# Patient Record
Sex: Male | Born: 1950 | Race: Black or African American | Hispanic: No | Smoking: Current every day smoker
Health system: Southern US, Community
[De-identification: ages and names within clinical notes are randomized; demographics above are authoritative.]

## PROBLEM LIST (undated history)

## (undated) DIAGNOSIS — I10 Essential (primary) hypertension: Secondary | ICD-10-CM

## (undated) DIAGNOSIS — C61 Malignant neoplasm of prostate: Secondary | ICD-10-CM

## (undated) DIAGNOSIS — E119 Type 2 diabetes mellitus without complications: Secondary | ICD-10-CM

## (undated) HISTORY — DX: Essential (primary) hypertension: I10

## (undated) HISTORY — PX: PROSTATE BIOPSY: SHX241

## (undated) HISTORY — DX: Type 2 diabetes mellitus without complications: E11.9

---

## 2003-12-13 ENCOUNTER — Emergency Department (HOSPITAL_COMMUNITY): Admission: EM | Admit: 2003-12-13 | Discharge: 2003-12-13 | Payer: Self-pay | Admitting: Family Medicine

## 2007-01-05 ENCOUNTER — Emergency Department (HOSPITAL_COMMUNITY): Admission: EM | Admit: 2007-01-05 | Discharge: 2007-01-05 | Payer: Self-pay | Admitting: Emergency Medicine

## 2015-11-01 DIAGNOSIS — E1159 Type 2 diabetes mellitus with other circulatory complications: Secondary | ICD-10-CM | POA: Insufficient documentation

## 2015-11-01 DIAGNOSIS — N3943 Post-void dribbling: Secondary | ICD-10-CM | POA: Insufficient documentation

## 2015-11-27 ENCOUNTER — Ambulatory Visit: Payer: Self-pay | Admitting: Cardiovascular Disease

## 2017-03-25 DIAGNOSIS — F17200 Nicotine dependence, unspecified, uncomplicated: Secondary | ICD-10-CM | POA: Insufficient documentation

## 2017-04-02 ENCOUNTER — Encounter: Payer: Self-pay | Admitting: Gastroenterology

## 2017-05-23 ENCOUNTER — Ambulatory Visit (AMBULATORY_SURGERY_CENTER): Payer: Self-pay | Admitting: *Deleted

## 2017-05-23 VITALS — Ht 70.0 in | Wt 197.2 lb

## 2017-05-23 DIAGNOSIS — Z1211 Encounter for screening for malignant neoplasm of colon: Secondary | ICD-10-CM

## 2017-05-23 MED ORDER — NA SULFATE-K SULFATE-MG SULF 17.5-3.13-1.6 GM/177ML PO SOLN: 1.0000 [IU] | Freq: Once | ORAL | 0 refills | Status: AC

## 2017-05-23 NOTE — Progress Notes (Signed)
No egg or soy allergy known to patient  No issues with past sedation with any surgeries  or procedures, no intubation problems  No diet pills per patient No home 02 use per patient  No blood thinners per patient  Pt denies issues with constipation  No A fib or A flutter  EMMI video sent to pt's e mail  

## 2017-05-26 ENCOUNTER — Encounter: Payer: Self-pay | Admitting: Gastroenterology

## 2017-06-02 ENCOUNTER — Encounter: Payer: Self-pay | Admitting: Gastroenterology

## 2017-06-02 ENCOUNTER — Ambulatory Visit (AMBULATORY_SURGERY_CENTER): Payer: Medicare Other | Admitting: Gastroenterology

## 2017-06-02 VITALS — BP 132/86 | HR 72 | Temp 97.3°F | Resp 10 | Ht 70.0 in | Wt 197.0 lb

## 2017-06-02 DIAGNOSIS — Z1212 Encounter for screening for malignant neoplasm of rectum: Secondary | ICD-10-CM

## 2017-06-02 DIAGNOSIS — D123 Benign neoplasm of transverse colon: Secondary | ICD-10-CM | POA: Diagnosis not present

## 2017-06-02 DIAGNOSIS — D125 Benign neoplasm of sigmoid colon: Secondary | ICD-10-CM | POA: Diagnosis not present

## 2017-06-02 DIAGNOSIS — D124 Benign neoplasm of descending colon: Secondary | ICD-10-CM

## 2017-06-02 DIAGNOSIS — Z1211 Encounter for screening for malignant neoplasm of colon: Secondary | ICD-10-CM | POA: Diagnosis not present

## 2017-06-02 MED ORDER — SODIUM CHLORIDE 0.9 % IV SOLN: 500.0000 mL | INTRAVENOUS | Status: AC

## 2017-06-02 NOTE — Patient Instructions (Signed)
Colon polyps removed today. Handouts given on polyps,diverticulosis, hemorrhoids. Result letter in your mail in 2-3 weeks. NO ASPIRIN, ASPIRIN CONTAINING PRODUCTS (BC OR GOODY POWDERS) OR NSAIDS (IBUPROFEN, ADVIL, ALEVE, AND MOTRIN) FOR 2 WEEKS; TYLENOL IS OK TO TAKE. Call us with any questions or concerns. Thank you!   YOU HAD AN ENDOSCOPIC PROCEDURE TODAY AT [facility]:   Refer to the procedure report that was given to you for any specific questions about what was found during the examination.  If the procedure report does not answer your questions, please call your gastroenterologist to clarify.  If you requested that your care partner not be given the details of your procedure findings, then the procedure report has been included in a sealed envelope for you to review at your convenience later.  YOU SHOULD EXPECT: Some feelings of bloating in the abdomen. Passage of more gas than usual.  Walking can help get rid of the air that was put into your GI tract during the procedure and reduce the bloating. If you had a lower endoscopy (such as a colonoscopy or flexible sigmoidoscopy) you may notice spotting of blood in your stool or on the toilet paper. If you underwent a bowel prep for your procedure, you may not have a normal bowel movement for a few days.  Please Note:  You might notice some irritation and congestion in your nose or some drainage.  This is from the oxygen used during your procedure.  There is no need for concern and it should clear up in a day or so.  SYMPTOMS TO REPORT IMMEDIATELY:   Following lower endoscopy (colonoscopy or flexible sigmoidoscopy):  Excessive amounts of blood in the stool  Significant tenderness or worsening of abdominal pains  Swelling of the abdomen that is new, acute  Fever of 100F or higher  For urgent or emergent issues, a gastroenterologist can be reached at any hour by calling [phone].   DIET:  We do recommend a small meal at  first, but then you may proceed to your regular diet.  Drink plenty of fluids but you should avoid alcoholic beverages for 24 hours.  ACTIVITY:  You should plan to take it easy for the rest of today and you should NOT DRIVE or use heavy machinery until tomorrow (because of the sedation medicines used during the test).    FOLLOW UP: Our staff will call the number listed on your records the next business day following your procedure to check on you and address any questions or concerns that you may have regarding the information given to you following your procedure. If we do not reach you, we will leave a message.  However, if you are feeling well and you are not experiencing any problems, there is no need to return our call.  We will assume that you have returned to your regular daily activities without incident.  If any biopsies were taken you will be contacted by phone or by letter within the next 1-3 weeks.  Please call us at [phone] if you have not heard about the biopsies in 3 weeks.    SIGNATURES/CONFIDENTIALITY: You and/or your care partner have signed paperwork which will be entered into your electronic medical record.  These signatures attest to the fact that that the information above on your After Visit Summary has been reviewed and is understood.  Full responsibility of the confidentiality of this discharge information lies with you and/or your care-partner.

## 2017-06-02 NOTE — Op Note (Signed)
[facility] Patient Name: Blake Camacho Procedure Date: 06/02/2017 1:10 PM MRN: [id number] Endoscopist: Remo Lipps P. Leyton Brownlee MD, MD Age: 66 Referring MD:  Date of Birth: 07-28-1951 Gender: Male Account #: [id number] Procedure:                Colonoscopy Indications:              Screening for colorectal malignant neoplasm, This                            is the patient's first colonoscopy Medicines:                Monitored Anesthesia Care Procedure:                Pre-Anesthesia Assessment:                           - Prior to the procedure, a History and Physical                            was performed, and patient medications and                            allergies were reviewed. The patient's tolerance of                            previous anesthesia was also reviewed. The risks                            and benefits of the procedure and the sedation                            options and risks were discussed with the patient.                            All questions were answered, and informed consent                            was obtained. Prior Anticoagulants: The patient has                            taken no previous anticoagulant or antiplatelet                            agents. ASA Grade Assessment: III - A patient with                            severe systemic disease. After reviewing the risks                            and benefits, the patient was deemed in                            satisfactory condition to undergo the procedure.  After obtaining informed consent, the colonoscope                            was passed under direct vision. Throughout the                            procedure, the patient's blood pressure, pulse, and                            oxygen saturations were monitored continuously. The                            Colonoscope was introduced through the anus and                            advanced to the  the terminal ileum, with                            identification of the appendiceal orifice and IC                            valve. The colonoscopy was performed without                            difficulty. The patient tolerated the procedure                            well. The quality of the bowel preparation was                            good. The terminal ileum, ileocecal valve,                            appendiceal orifice, and rectum were photographed. Scope In: 1:16:14 PM Scope Out: 1:31:45 PM Scope Withdrawal Time: 0 hours 12 minutes 56 seconds  Total Procedure Duration: 0 hours 15 minutes 31 seconds  Findings:                 The perianal and digital rectal examinations were                            normal.                           The terminal ileum appeared normal.                           A 4 mm polyp was found in the transverse colon. The                            polyp was sessile. The polyp was removed with a                            cold snare. Resection and retrieval were complete.  A 4 mm polyp was found in the splenic flexure. The                            polyp was sessile. The polyp was removed with a                            cold snare. Resection and retrieval were complete.                           A 4 mm polyp was found in the descending colon. The                            polyp was sessile. The polyp was removed with a                            cold snare. Resection and retrieval were complete.                           A 3 mm polyp was found in the sigmoid colon. The                            polyp was sessile. The polyp was removed with a                            cold snare. Resection and retrieval were complete.                           A few medium-mouthed diverticula were found in the                            sigmoid colon.                           Internal hemorrhoids were found during retroflexion.                            The exam was otherwise without abnormality. Complications:            No immediate complications. Estimated blood loss:                            Minimal. Estimated Blood Loss:     Estimated blood loss was minimal. Impression:               - The examined portion of the ileum was normal.                           - One 4 mm polyp in the transverse colon, removed                            with a cold snare. Resected and retrieved.                           -  One 4 mm polyp at the splenic flexure, removed                            with a cold snare. Resected and retrieved.                           - One 4 mm polyp in the descending colon, removed                            with a cold snare. Resected and retrieved.                           - One 3 mm polyp in the sigmoid colon, removed with                            a cold snare. Resected and retrieved.                           - Diverticulosis in the sigmoid colon.                           - Internal hemorrhoids.                           - The examination was otherwise normal. Recommendation:           - Patient has a contact number available for                            emergencies. The signs and symptoms of potential                            delayed complications were discussed with the                            patient. Return to normal activities tomorrow.                            Written discharge instructions were provided to the                            patient.                           - Resume previous diet.                           - Continue present medications.                           - Await pathology results.                           - Repeat colonoscopy is recommended for  surveillance. The colonoscopy date will be                            determined after pathology results from today's                            exam become available for review.                            - No ibuprofen, naproxen, or other non-steroidal                            anti-inflammatory drugs for 2 weeks after polyp                            removal. Remo Lipps P. Pedro Whiters MD, MD 06/02/2017 1:38:29 PM This report has been signed electronically.

## 2017-06-02 NOTE — Progress Notes (Signed)
Called to room to assist during endoscopic procedure.  Patient ID and intended procedure confirmed with present staff. Received instructions for my participation in the procedure from the performing physician.  

## 2017-06-02 NOTE — Progress Notes (Signed)
To recovery, report to Myers, RN, VSS. 

## 2017-06-03 ENCOUNTER — Telehealth: Payer: Self-pay | Admitting: *Deleted

## 2017-06-03 NOTE — Telephone Encounter (Signed)
  Follow up Call-  Call back number 06/02/2017  Post procedure Call Back phone  # [phone]  Permission to leave phone message Yes  Some recent data might be hidden     Patient questions:  Do you have a fever, pain , or abdominal swelling? No. Pain Score  0 *  Have you tolerated food without any problems? Yes.    Have you been able to return to your normal activities? Yes.    Do you have any questions about your discharge instructions: Diet   No. Medications  No. Follow up visit  No.  Do you have questions or concerns about your Care? No.  Actions: * If pain score is 4 or above: No action needed, pain <4.

## 2017-06-03 NOTE — Telephone Encounter (Signed)
  Follow up Call-  Call back number 06/02/2017  Post procedure Call Back phone  # [phone]  Permission to leave phone message Yes  Some recent data might be hidden     No answer, left message.

## 2017-06-10 ENCOUNTER — Encounter: Payer: Self-pay | Admitting: Gastroenterology

## 2017-06-26 DIAGNOSIS — E1169 Type 2 diabetes mellitus with other specified complication: Secondary | ICD-10-CM | POA: Insufficient documentation

## 2018-07-17 ENCOUNTER — Other Ambulatory Visit: Payer: Self-pay | Admitting: Family Medicine

## 2018-07-17 DIAGNOSIS — Z136 Encounter for screening for cardiovascular disorders: Secondary | ICD-10-CM

## 2018-07-28 ENCOUNTER — Ambulatory Visit
Admission: RE | Admit: 2018-07-28 | Discharge: 2018-07-28 | Disposition: A | Discharge status: Home / Self Care | Payer: Medicare Other | Source: Ambulatory Visit | Attending: Family Medicine | Admitting: Family Medicine

## 2018-07-28 DIAGNOSIS — Z136 Encounter for screening for cardiovascular disorders: Secondary | ICD-10-CM

## 2019-04-19 DIAGNOSIS — F5101 Primary insomnia: Secondary | ICD-10-CM | POA: Insufficient documentation

## 2019-07-17 DIAGNOSIS — R972 Elevated prostate specific antigen [PSA]: Secondary | ICD-10-CM | POA: Insufficient documentation

## 2019-08-23 ENCOUNTER — Other Ambulatory Visit: Payer: Self-pay | Admitting: Urology

## 2019-08-23 ENCOUNTER — Other Ambulatory Visit (HOSPITAL_COMMUNITY): Payer: Self-pay | Admitting: Urology

## 2019-08-23 DIAGNOSIS — C61 Malignant neoplasm of prostate: Secondary | ICD-10-CM

## 2019-09-06 ENCOUNTER — Encounter (HOSPITAL_COMMUNITY)
Admission: RE | Admit: 2019-09-06 | Discharge: 2019-09-06 | Disposition: A | Discharge status: Home / Self Care | Payer: Medicare Other | Source: Ambulatory Visit | Attending: Urology | Admitting: Urology

## 2019-09-06 ENCOUNTER — Other Ambulatory Visit: Payer: Self-pay

## 2019-09-06 DIAGNOSIS — C61 Malignant neoplasm of prostate: Secondary | ICD-10-CM | POA: Diagnosis not present

## 2019-09-06 MED ORDER — TECHNETIUM TC 99M MEDRONATE IV KIT
21.8000 | PACK | Freq: Once | INTRAVENOUS | Status: AC
  Administered 2019-09-06: 12:00:00 21.8 via INTRAVENOUS

## 2019-09-16 ENCOUNTER — Other Ambulatory Visit (HOSPITAL_COMMUNITY): Payer: Self-pay | Admitting: Urology

## 2019-09-16 ENCOUNTER — Other Ambulatory Visit: Payer: Self-pay

## 2019-09-16 ENCOUNTER — Ambulatory Visit (HOSPITAL_COMMUNITY)
Admission: RE | Admit: 2019-09-16 | Discharge: 2019-09-16 | Disposition: A | Discharge status: Home / Self Care | Payer: Medicare Other | Source: Ambulatory Visit | Attending: Urology | Admitting: Urology

## 2019-09-16 DIAGNOSIS — R0981 Nasal congestion: Secondary | ICD-10-CM | POA: Diagnosis present

## 2019-09-16 DIAGNOSIS — M542 Cervicalgia: Secondary | ICD-10-CM

## 2019-09-23 ENCOUNTER — Encounter: Payer: Self-pay | Admitting: *Deleted

## 2019-09-30 ENCOUNTER — Encounter: Payer: Self-pay | Admitting: Radiation Oncology

## 2019-09-30 NOTE — Progress Notes (Signed)
GU Location of Tumor / Histology: prostatic adenocarcinoma  If Prostate Cancer, Gleason Score is (4 + 5) and PSA is (9.9) on 05/2019. Prostate volume: 32 g.  Blake Camacho went to his PCP for routine physical and blood work reveal his elevated PSA earlier this year. Patient denies a family history of breast, prostate, colon or pancreatic ca.  Biopsies of prostate (if applicable) revealed:   Past/Anticipated interventions by urology, if any: prostate biopsy, CT abd pelvis (negative), bone scan (negative), referral for consideration of radiotherapy  Past/Anticipated interventions by medical oncology, if any: no  Weight changes, if any: denies  Bowel/Bladder complaints, if any: IPSS 8. SHIM 3 due to inactivity. Denies dysuria, hematuria, leakage or incontinence. Reports urinary intermittency. Reports urinary hesitancy even having to strain at times.   Nausea/Vomiting, if any: denies  Pain issues, if any:  denies  SAFETY ISSUES:  Prior radiation? denies  Pacemaker/ICD? denies  Possible current pregnancy? no, male patient  Is the patient on methotrexate? no  Current Complaints / other details:  68 year old male. Smoker. Single. Lives alone. Retired at 58. Has difficulty sleeping.

## 2019-10-01 ENCOUNTER — Ambulatory Visit
Admission: RE | Admit: 2019-10-01 | Discharge: 2019-10-01 | Disposition: A | Discharge status: Home / Self Care | Payer: Medicare Other | Source: Ambulatory Visit | Attending: Radiation Oncology | Admitting: Radiation Oncology

## 2019-10-01 ENCOUNTER — Other Ambulatory Visit: Payer: Self-pay

## 2019-10-01 ENCOUNTER — Encounter: Payer: Self-pay | Admitting: Radiation Oncology

## 2019-10-01 VITALS — Ht 69.0 in | Wt 192.0 lb

## 2019-10-01 DIAGNOSIS — C61 Malignant neoplasm of prostate: Secondary | ICD-10-CM

## 2019-10-01 HISTORY — DX: Malignant neoplasm of prostate: C61

## 2019-10-01 NOTE — Progress Notes (Signed)
Radiation Oncology         (336) [phone] ________________________________  Initial outpatient Consultation - Conducted via Telephone due to current COVID-19 concerns for limiting patient exposure  Name: ROHNAN BARTLESON MRN: [id number]  Date: 10/01/2019  DOB: 09/02/1951  NT:[id number], Santiago Glad, MD  Festus Aloe, MD   REFERRING PHYSICIAN: Festus Aloe, MD  DIAGNOSIS: 68 y.o. 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    ICD-10-CM   1. Malignant neoplasm of prostate ([place])  C61     HISTORY OF PRESENT ILLNESS: NAZIRE FRUTH is a 68 y.o. male with a diagnosis of prostate cancer. He was noted to have an elevated PSA of 4.9 in 2017, by his primary care physician, Dr. Coletta Memos.  A referral was placed for urology, but he never went. His PSA continued to be monitored by his PCP as follows-- 03/2018 - 7.7 04/2019 - 10.6 05/2019 - 9.9 06/2019 - 10.2 ([facility]) 07/2019 - 10.8  At the time of his routine visit with his PCP in 04/2019, he was having increased LUTS and PSA was noted to be further elevated at 10.6.  The PSA was repeated in 05/2019 and remained elevated at 9.9 despite a course of antibiotics so he was referred for evaluation in urology and met with Dr. Junious Silk on 07/05/2019,  digital rectal examination was performed at that time revealing left lobe firmness.  The patient proceeded to transrectal ultrasound with 12 biopsies of the prostate on 08/17/2019.  The prostate volume measured 31.76 cc.  Out of 12 core biopsies, 7 were positive, including all left cores and one on the right.  The maximum Gleason score was 4+5, and this was seen in the left mid with perineural invasion. Additionally, Gleason 4+4 was seen in the right mid (two small foci), left base lateral (with PNI), and left apex (with PNI) and Gleason 4+3 was seen in the left mid lateral, left apex lateral (with PNI) and left base.  A CT A/P was performed on 08/31/2019 for  disease staging and was without evidence of lymphadenopathy or other definite findings for metastatic disease.  There were 3 small hypervascular foci in the liver parenchyma and probable bone islands in left femoral neck and head.  Bone scan performed on 09/06/2019 showed increased activity over the right maxillary sinus with punctate areas of increased activity over the cervical spine. In light of these areas of uptake, he underwent sinus and cervical spine x-rays on 09/16/2019. Sinus x-ray was negative and Cervical spine x-rays showed degenerative changes only.  The patient reviewed the biopsy results with his urologist and he has kindly been referred today for discussion of potential radiation treatment options.   PREVIOUS RADIATION THERAPY: No  PAST MEDICAL HISTORY:  Past Medical History:  Diagnosis Date  . Diabetes mellitus without complication ([place])   . Hypertension   . Prostate cancer ([place])       PAST SURGICAL HISTORY: Past Surgical History:  Procedure Laterality Date  . PROSTATE BIOPSY      FAMILY HISTORY:  Family History  Problem Relation Age of Onset  . Diabetes Mother   . Asthma Mother   . Colon cancer Neg Hx   . Colon polyps Neg Hx   . Esophageal cancer Neg Hx   . Rectal cancer Neg Hx   . Stomach cancer Neg Hx   . Breast cancer Neg Hx   . Prostate cancer Neg Hx     SOCIAL HISTORY:  Social History   Socioeconomic History  . Marital status: Legally Separated    Spouse name: Not on file  . Number of children: Not on file  . Years of education: Not on file  . Highest education level: Not on file  Occupational History  . Not on file  Tobacco Use  . Smoking status: Current Every Day Smoker    Packs/day: 1.50    Years: 50.00    Pack years: 75.00    Types: Cigarettes  . Smokeless tobacco: Never Used  Substance and Sexual Activity  . Alcohol use: Yes    Comment: on occasions   . Drug use: No  . Sexual activity: Not Currently  Other Topics Concern  . Not  on file  Social History Narrative  . Not on file   Social Determinants of Health   Financial Resource Strain:   . Difficulty of Paying Living Expenses: Not on file  Food Insecurity:   . Worried About Charity fundraiser in the Last Year: Not on file  . Ran Out of Food in the Last Year: Not on file  Transportation Needs:   . Lack of Transportation (Medical): Not on file  . Lack of Transportation (Non-Medical): Not on file  Physical Activity:   . Days of Exercise per Week: Not on file  . Minutes of Exercise per Session: Not on file  Stress:   . Feeling of Stress : Not on file  Social Connections:   . Frequency of Communication with Friends and Family: Not on file  . Frequency of Social Gatherings with Friends and Family: Not on file  . Attends Religious Services: Not on file  . Active Member of Clubs or Organizations: Not on file  . Attends Archivist Meetings: Not on file  . Marital Status: Not on file  Intimate Partner Violence:   . Fear of Current or Ex-Partner: Not on file  . Emotionally Abused: Not on file  . Physically Abused: Not on file  . Sexually Abused: Not on file    ALLERGIES: Aspirin  MEDICATIONS:  Current Outpatient Medications  Medication Sig Dispense Refill  . amLODipine (NORVASC) 10 MG tablet TAKE ONE TABLET BY MOUTH ONCE DAILY    . atorvastatin (LIPITOR) 10 MG tablet TAKE ONE TABLET BY MOUTH ONCE DAILY    . hydrochlorothiazide (HYDRODIURIL) 25 MG tablet TAKE ONE TABLET BY MOUTH ONCE DAILY IN THE MORNING    . metFORMIN (GLUCOPHAGE) 500 MG tablet TAKE ONE TABLET BY MOUTH DAILY WITH BREAKFAST FOR 1 WEEK, THEN INCREASE TO ONE TABLET TWICE DAILY WITH BREAKFAST AND DINNER.     Current Facility-Administered Medications  Medication Dose Route Frequency Provider Last Rate Last Admin  . 0.9 %  sodium chloride infusion  500 mL Intravenous Continuous Armbruster, Carlota Raspberry, MD        REVIEW OF SYSTEMS:  On review of systems, the patient reports that he is  doing well overall. He denies any chest pain, shortness of breath, cough, fevers, chills, night sweats, unintended weight changes. He denies any bowel disturbances, and denies abdominal pain, nausea or vomiting. He denies any new musculoskeletal or joint aches or pains. His IPSS was 8, indicating moderate urinary symptoms. He reports urinary intermittency and hesitancy with having to strain at times. His SHIM was 3 due to inactivity. A complete review of systems is obtained and is otherwise negative.    PHYSICAL EXAM:  Wt Readings from Last 3 Encounters:  10/01/19 192 lb (87.1 kg)  06/02/17 197 lb (89.4 kg)  05/23/17 197 lb 3.2 oz (89.4 kg)   Temp Readings from Last 3 Encounters:  06/02/17 (!) 97.3 F (36.3 C)   BP Readings from Last 3 Encounters:  06/02/17 132/86   Pulse Readings from Last 3 Encounters:  06/02/17 72   Pain Assessment Pain Score: 0-No pain/10  Physical exam not performed in light of telephone consult visit format.   KPS = 90  100 - Normal; no complaints; no evidence of disease. 90   - Able to carry on normal activity; minor signs or symptoms of disease. 80   - Normal activity with effort; some signs or symptoms of disease. 28   - Cares for self; unable to carry on normal activity or to do active work. 60   - Requires occasional assistance, but is able to care for most of his personal needs. 50   - Requires considerable assistance and frequent medical care. 24   - Disabled; requires special care and assistance. 72   - Severely disabled; hospital admission is indicated although death not imminent. 36   - Very sick; hospital admission necessary; active supportive treatment necessary. 10   - Moribund; fatal processes progressing rapidly. 0     - Dead  Karnofsky DA, Abelmann WH, Craver LS and Burchenal JH [phone]) The use of the nitrogen mustards in the palliative treatment of carcinoma: with particular reference to bronchogenic carcinoma Cancer 1 634-56  LABORATORY  DATA:  No results found for: WBC, HGB, HCT, MCV, PLT No results found for: NA, K, CL, CO2 No results found for: ALT, AST, GGT, ALKPHOS, BILITOT   RADIOGRAPHY: DG Sinuses Complete  Result Date: 09/16/2019 CLINICAL DATA:  Sinus congestion.  Abnormal uptake on bone scan. EXAM: PARANASAL SINUSES - COMPLETE 3 + VIEW COMPARISON:  September 06, 2019. FINDINGS: The paranasal sinus are aerated. There is no evidence of sinus opacification air-fluid levels or mucosal thickening. No significant bone abnormalities are seen. IMPRESSION: Negative. Electronically Signed   By: Marijo Conception M.D.   On: 09/16/2019 14:13   DG Cervical Spine Complete  Result Date: 09/16/2019 CLINICAL DATA:  Neck pain. Abnormal uptake on bone scan. EXAM: CERVICAL SPINE - COMPLETE 4+ VIEW COMPARISON:  September 06, 2019. FINDINGS: Mild grade 1 retrolisthesis of C3-4 is noted secondary to severe degenerative disc disease at this level. Anterior osteophyte formation is also noted at C3-4. No fracture is noted. No prevertebral soft tissue swelling is noted. Remaining disc spaces are unremarkable. Mild neural foraminal stenosis is noted on the left at C3-4 secondary to uncovertebral spurring. IMPRESSION: Severe degenerative disc disease is noted at C3-4. Mild neural foraminal stenosis is noted on the left at C3-4 secondary to uncovertebral spurring. No acute abnormality seen in the cervical spine. Electronically Signed   By: Marijo Conception M.D.   On: 09/16/2019 14:15   NM Bone Scan Whole Body  Result Date: 09/07/2019 CLINICAL DATA:  Prostate cancer. EXAM: NUCLEAR MEDICINE WHOLE BODY BONE SCAN TECHNIQUE: Whole body anterior and posterior images were obtained approximately 3 hours after intravenous injection of radiopharmaceutical. RADIOPHARMACEUTICALS:  21.8 mCi Technetium-59mMDP IV COMPARISON:  CT images 08/31/2019, no report available. FINDINGS: Bilateral renal function excretion noted. Increased activity noted over the right maxillary  sinus. Although this could be related sinusitis, sinus series suggested to exclude bony lesion. Punctate areas of increased activity noted over the cervical spine. Cervical spine series suggested for further evaluation. Mild thoracolumbar spine scoliosis. No other bony abnormality identified. IMPRESSION: 1.  Increased activity noted over the right maxillary sinus. Although this could be related sinusitis, sinus series suggested to exclude bony lesion. 2. Punctate areas of increased activity noted over the cervical spine. Cervical spine series suggested for further evaluation. Electronically Signed   By: Marcello Moores  Register   On: 09/07/2019 07:30      IMPRESSION/PLAN: This visit was conducted via Telephone to spare the patient unnecessary potential exposure in the healthcare setting during the current COVID-19 pandemic. 1. 68 y.o. gentleman with Stage T2b adenocarcinoma of the prostate with Gleason Score of 4+5, and PSA of 10. We discussed the patient's workup and outlined the nature of prostate cancer in this setting. The patient's T stage, Gleason's score, and PSA put him into the high risk group. Accordingly, he is eligible for a variety of potential treatment options including LT-ADT in combination with either 8 weeks of external radiation or 5 weeks of external radiation combined with a brachytherapy boost. We discussed the available radiation techniques, and focused on the details and logistics and delivery. We discussed and outlined the risks, benefits, short and long-term effects associated with radiotherapy and compared and contrasted these with prostatectomy. We discussed the role of SpaceOAR in reducing the rectal toxicity associated with radiotherapy. We also detailed the role of ADT in the treatment of high risk prostate cancer and outlined the associated side effects that could be expected with this therapy.  We discussed the rationale behind the intentional delay of start of the radiation for  approximately 2 months after starting ADT to allow for the radiosensitizing effects of this therapy.  He was encouraged to ask questions that were answered to his stated satisfaction.  At the end of the conversation, the patient is interested in moving forward with 8 weeks of external beam therapy in combination with LT-ADT. We will share our discussion with Dr. Junious Silk and move forward with coordinating a follow up visit for start of ADT, first available.  We will also arrange for placement of fiducial markers and SpaceOAR gel to reduce rectal toxicity from radiotherapy, prior to CT simulation/treatment planning in anticipation of beginning IMRT in late February or early March 2021. He appears to have a good understanding of his disease and our treatment recommendations which are of curative intent.  He is comfortable and in agreement with the stated plan so we will proceed with treatment planning accordingly.  Given current concerns for patient exposure during the COVID-19 pandemic, this encounter was conducted via telephone. The patient was notified in advance and was offered a MyChart meeting to allow for face to face communication but unfortunately reported that he did not have the appropriate resources/technology to support such a visit and instead preferred to proceed with telephone consult. The patient has given verbal consent for this type of encounter. The time spent during this encounter was 60 minutes. The attendants for this meeting include Tyler Pita MD, Ashlyn Bruning PA-C, [place], patient, SAHAN PEN. During the encounter, Tyler Pita MD, Ashlyn Bruning PA-C, and scribe, Wilburn Mylar were located at [place].  Patient, REUVEN BRAVER was located at home.    Nicholos Johns, PA-C    Tyler Pita, MD  [facility] Direct Dial: [phone]  Fax:  [phone] [email or web address]  Skype  [email or web address]  This document serves as a record of services personally performed by Tyler Pita, MD and Freeman Caldron, PA-C. It was created on their behalf by Wilburn Mylar, a trained  medical scribe. The creation of this record is based on the scribe's personal observations and the provider's statements to them. This document has been checked and approved by the attending provider.

## 2019-10-01 NOTE — Progress Notes (Signed)
See progress note under physician encounter. 

## 2019-10-03 DIAGNOSIS — C61 Malignant neoplasm of prostate: Secondary | ICD-10-CM | POA: Insufficient documentation

## 2019-10-12 ENCOUNTER — Encounter: Payer: Self-pay | Admitting: Medical Oncology

## 2019-10-12 ENCOUNTER — Telehealth: Payer: Self-pay | Admitting: Medical Oncology

## 2019-10-13 ENCOUNTER — Encounter: Payer: Self-pay | Admitting: Urology

## 2019-10-13 NOTE — Progress Notes (Signed)
Patient received Eligard 45mg  10/12/19 with Dr. Junious Silk.  Request sent to [facility] to move forward with coordinating fiducial markers and SpaceOAR gel placement with Dr. Junious Silk in late 11/2019 in anticipation of beginning radiation treatments in early 12/2019.  Nicholos Johns, MMS, PA-C [facility]: [phone]  Fax: [phone]

## 2019-10-19 NOTE — Telephone Encounter (Signed)
Spoke with patient to introduce myself as the prostate nurse navigator and discuss my role. I was unable to meet him 12/18 when he consulted with Dr. Tammi Klippel. He states the visit went well and decided on ADT and 8 weeks of radiation. He received his ADT 12/29. I discussed with him the next step will be placement of gold markers and SpaceOar gel. He is aware Enid Derry will contact him with appointment. I gave him my contact information and asked him to call me with questions or concerns. He voiced understanding.

## 2019-11-04 ENCOUNTER — Telehealth: Payer: Self-pay | Admitting: *Deleted

## 2019-11-04 NOTE — Telephone Encounter (Signed)
Called patient to inform of fid. marker and space oar placement on 12-02-19 @ [facility] and his sim will be on Feb. 26 @ 9 am @ Dr. Johny Shears office, lvm for a return call

## 2019-11-05 ENCOUNTER — Other Ambulatory Visit: Payer: Self-pay | Admitting: Urology

## 2019-11-05 DIAGNOSIS — C61 Malignant neoplasm of prostate: Secondary | ICD-10-CM

## 2019-11-30 ENCOUNTER — Telehealth: Payer: Self-pay | Admitting: *Deleted

## 2019-11-30 ENCOUNTER — Other Ambulatory Visit: Payer: Self-pay | Admitting: Urology

## 2019-11-30 MED ORDER — LORAZEPAM 1 MG PO TABS: 1.0000 mg | ORAL_TABLET | ORAL | 0 refills | Status: AC | PRN

## 2019-11-30 NOTE — Telephone Encounter (Signed)
Called patient to inform of sim appt. for 12-10-19 and his MRI on 12-10-19, test to be @ [facility], spoke with patient and he is aware of these appts.

## 2019-12-06 ENCOUNTER — Telehealth: Payer: Self-pay | Admitting: *Deleted

## 2019-12-06 NOTE — Telephone Encounter (Signed)
CALLED PATIENT TO INFORM SIM AND MRI HAS BEEN MOVED TO 12-17-19, SPOKE WITH PATIENT AND HE IS AWARE OF THESE CHANGES AND IS GOOD WITH THEM

## 2019-12-07 ENCOUNTER — Ambulatory Visit (HOSPITAL_COMMUNITY): Payer: Medicare PPO

## 2019-12-10 ENCOUNTER — Encounter: Payer: Self-pay | Admitting: *Deleted

## 2019-12-10 ENCOUNTER — Ambulatory Visit (HOSPITAL_COMMUNITY): Payer: Medicare PPO

## 2019-12-10 ENCOUNTER — Ambulatory Visit: Payer: Medicare PPO | Admitting: Radiation Oncology

## 2019-12-16 ENCOUNTER — Telehealth: Payer: Self-pay | Admitting: *Deleted

## 2019-12-16 NOTE — Telephone Encounter (Signed)
CALLED PATIENT TO REMIND OF SIM AND MRI FOR 12-17-19, SPOKE WITH PATIENT AND HE IS AWARE OF THESE APPTS.

## 2019-12-17 ENCOUNTER — Encounter: Payer: Self-pay | Admitting: Medical Oncology

## 2019-12-17 ENCOUNTER — Ambulatory Visit
Admission: RE | Admit: 2019-12-17 | Discharge: 2019-12-17 | Disposition: A | Discharge status: Home / Self Care | Payer: Medicare PPO | Source: Ambulatory Visit | Attending: Radiation Oncology | Admitting: Radiation Oncology

## 2019-12-17 ENCOUNTER — Other Ambulatory Visit: Payer: Self-pay

## 2019-12-17 ENCOUNTER — Ambulatory Visit: Payer: Medicare PPO | Admitting: Radiation Oncology

## 2019-12-17 ENCOUNTER — Ambulatory Visit (HOSPITAL_COMMUNITY)
Admission: RE | Admit: 2019-12-17 | Discharge: 2019-12-17 | Disposition: A | Discharge status: Home / Self Care | Payer: Medicare PPO | Source: Ambulatory Visit | Attending: Urology | Admitting: Urology

## 2019-12-17 DIAGNOSIS — Z510 Encounter for antineoplastic radiation therapy: Secondary | ICD-10-CM | POA: Insufficient documentation

## 2019-12-17 DIAGNOSIS — C61 Malignant neoplasm of prostate: Secondary | ICD-10-CM | POA: Insufficient documentation

## 2019-12-19 NOTE — Progress Notes (Signed)
  Radiation Oncology         (336) [phone] ________________________________  Name: DEANTA COLANDREA MRN: [id number]  Date: 12/17/2019  DOB: [birth year]  SIMULATION AND TREATMENT PLANNING NOTE    ICD-10-CM   1. Malignant neoplasm of prostate ([place])  C61     DIAGNOSIS:  : 69 y.o. 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  Identity was confirmed.  All relevant records and images related to the planned course of therapy were reviewed.  The patient freely provided informed written consent to proceed with treatment after reviewing the details related to the planned course of therapy. The consent form was witnessed and verified by the simulation staff.  Then, the patient was set-up in a stable reproducible supine position for radiation therapy.  A vacuum lock pillow device was custom fabricated to position his legs in a reproducible immobilized position.  Then, I performed a urethrogram under sterile conditions to identify the prostatic bed.  CT images were obtained.  Surface markings were placed.  The CT images were loaded into the planning software.  Then the prostate bed target, pelvic lymph node target and avoidance structures including the rectum, bladder, bowel and hips were contoured.  Treatment planning then occurred.  The radiation prescription was entered and confirmed.  A total of one complex treatment devices were fabricated. I have requested : Intensity Modulated Radiotherapy (IMRT) is medically necessary for this case for the following reason:  Rectal sparing.Marland Kitchen  PLAN:  The patient will receive 45 Gy in 25 fractions of 1.8 Gy, followed by a boost to the prostate to a total dose of 75 Gy with 15 additional fractions of 2 Gy.   ________________________________  Sheral Apley Tammi Klippel, M.D.

## 2019-12-24 DIAGNOSIS — Z510 Encounter for antineoplastic radiation therapy: Secondary | ICD-10-CM | POA: Diagnosis not present

## 2019-12-27 ENCOUNTER — Ambulatory Visit: Payer: Medicare PPO

## 2019-12-27 DIAGNOSIS — Z510 Encounter for antineoplastic radiation therapy: Secondary | ICD-10-CM | POA: Diagnosis not present

## 2019-12-28 ENCOUNTER — Ambulatory Visit: Payer: Medicare PPO

## 2019-12-28 ENCOUNTER — Other Ambulatory Visit: Payer: Self-pay

## 2019-12-28 ENCOUNTER — Ambulatory Visit
Admission: RE | Admit: 2019-12-28 | Discharge: 2019-12-28 | Disposition: A | Discharge status: Home / Self Care | Payer: Medicare PPO | Source: Ambulatory Visit | Attending: Radiation Oncology | Admitting: Radiation Oncology

## 2019-12-28 ENCOUNTER — Encounter: Payer: Self-pay | Admitting: Medical Oncology

## 2019-12-28 DIAGNOSIS — Z510 Encounter for antineoplastic radiation therapy: Secondary | ICD-10-CM | POA: Diagnosis not present

## 2019-12-29 ENCOUNTER — Ambulatory Visit
Admission: RE | Admit: 2019-12-29 | Discharge: 2019-12-29 | Disposition: A | Discharge status: Home / Self Care | Payer: Medicare PPO | Source: Ambulatory Visit | Attending: Radiation Oncology | Admitting: Radiation Oncology

## 2019-12-29 ENCOUNTER — Other Ambulatory Visit: Payer: Self-pay

## 2019-12-29 DIAGNOSIS — Z510 Encounter for antineoplastic radiation therapy: Secondary | ICD-10-CM | POA: Diagnosis not present

## 2019-12-30 ENCOUNTER — Ambulatory Visit
Admission: RE | Admit: 2019-12-30 | Discharge: 2019-12-30 | Disposition: A | Discharge status: Home / Self Care | Payer: Medicare PPO | Source: Ambulatory Visit | Attending: Radiation Oncology | Admitting: Radiation Oncology

## 2019-12-30 ENCOUNTER — Other Ambulatory Visit: Payer: Self-pay

## 2019-12-30 DIAGNOSIS — Z510 Encounter for antineoplastic radiation therapy: Secondary | ICD-10-CM | POA: Diagnosis not present

## 2019-12-31 ENCOUNTER — Ambulatory Visit
Admission: RE | Admit: 2019-12-31 | Discharge: 2019-12-31 | Disposition: A | Discharge status: Home / Self Care | Payer: Medicare PPO | Source: Ambulatory Visit | Attending: Radiation Oncology | Admitting: Radiation Oncology

## 2019-12-31 ENCOUNTER — Other Ambulatory Visit: Payer: Self-pay

## 2019-12-31 DIAGNOSIS — Z510 Encounter for antineoplastic radiation therapy: Secondary | ICD-10-CM | POA: Diagnosis not present

## 2019-12-31 DIAGNOSIS — C61 Malignant neoplasm of prostate: Secondary | ICD-10-CM

## 2019-12-31 NOTE — Progress Notes (Signed)
Completed post sim education with patient today oriented patient to staff and routine of clinic provided patient with radiation therapy and you handbook then reviewed pertinent information educated patient reference potential side effects and management such as fatigue, diarrhea, and urinary bladder changes. Answered all patient questions to the best of my ability. Provided patient with  Patient verbalized understanding of all information provided.

## 2020-01-03 ENCOUNTER — Ambulatory Visit
Admission: RE | Admit: 2020-01-03 | Discharge: 2020-01-03 | Disposition: A | Discharge status: Home / Self Care | Payer: Medicare PPO | Source: Ambulatory Visit | Attending: Radiation Oncology | Admitting: Radiation Oncology

## 2020-01-03 ENCOUNTER — Other Ambulatory Visit: Payer: Self-pay

## 2020-01-03 DIAGNOSIS — Z510 Encounter for antineoplastic radiation therapy: Secondary | ICD-10-CM | POA: Diagnosis not present

## 2020-01-04 ENCOUNTER — Other Ambulatory Visit: Payer: Self-pay

## 2020-01-04 ENCOUNTER — Ambulatory Visit
Admission: RE | Admit: 2020-01-04 | Discharge: 2020-01-04 | Disposition: A | Discharge status: Home / Self Care | Payer: Medicare PPO | Source: Ambulatory Visit | Attending: Radiation Oncology | Admitting: Radiation Oncology

## 2020-01-04 DIAGNOSIS — Z510 Encounter for antineoplastic radiation therapy: Secondary | ICD-10-CM | POA: Diagnosis not present

## 2020-01-05 ENCOUNTER — Ambulatory Visit
Admission: RE | Admit: 2020-01-05 | Discharge: 2020-01-05 | Disposition: A | Discharge status: Home / Self Care | Payer: Medicare PPO | Source: Ambulatory Visit | Attending: Radiation Oncology | Admitting: Radiation Oncology

## 2020-01-05 DIAGNOSIS — Z510 Encounter for antineoplastic radiation therapy: Secondary | ICD-10-CM | POA: Diagnosis not present

## 2020-01-06 ENCOUNTER — Other Ambulatory Visit: Payer: Self-pay

## 2020-01-06 ENCOUNTER — Ambulatory Visit
Admission: RE | Admit: 2020-01-06 | Discharge: 2020-01-06 | Disposition: A | Discharge status: Home / Self Care | Payer: Medicare PPO | Source: Ambulatory Visit | Attending: Radiation Oncology | Admitting: Radiation Oncology

## 2020-01-06 DIAGNOSIS — Z510 Encounter for antineoplastic radiation therapy: Secondary | ICD-10-CM | POA: Diagnosis not present

## 2020-01-07 ENCOUNTER — Ambulatory Visit
Admission: RE | Admit: 2020-01-07 | Discharge: 2020-01-07 | Disposition: A | Discharge status: Home / Self Care | Payer: Medicare PPO | Source: Ambulatory Visit | Attending: Radiation Oncology | Admitting: Radiation Oncology

## 2020-01-07 ENCOUNTER — Other Ambulatory Visit: Payer: Self-pay

## 2020-01-07 DIAGNOSIS — Z510 Encounter for antineoplastic radiation therapy: Secondary | ICD-10-CM | POA: Diagnosis not present

## 2020-01-10 ENCOUNTER — Other Ambulatory Visit: Payer: Self-pay

## 2020-01-10 ENCOUNTER — Ambulatory Visit
Admission: RE | Admit: 2020-01-10 | Discharge: 2020-01-10 | Disposition: A | Discharge status: Home / Self Care | Payer: Medicare PPO | Source: Ambulatory Visit | Attending: Radiation Oncology | Admitting: Radiation Oncology

## 2020-01-10 DIAGNOSIS — Z510 Encounter for antineoplastic radiation therapy: Secondary | ICD-10-CM | POA: Diagnosis not present

## 2020-01-11 ENCOUNTER — Other Ambulatory Visit: Payer: Self-pay

## 2020-01-11 ENCOUNTER — Ambulatory Visit
Admission: RE | Admit: 2020-01-11 | Discharge: 2020-01-11 | Disposition: A | Discharge status: Home / Self Care | Payer: Medicare PPO | Source: Ambulatory Visit | Attending: Radiation Oncology | Admitting: Radiation Oncology

## 2020-01-11 DIAGNOSIS — Z510 Encounter for antineoplastic radiation therapy: Secondary | ICD-10-CM | POA: Diagnosis not present

## 2020-01-12 ENCOUNTER — Other Ambulatory Visit: Payer: Self-pay

## 2020-01-12 ENCOUNTER — Ambulatory Visit
Admission: RE | Admit: 2020-01-12 | Discharge: 2020-01-12 | Disposition: A | Discharge status: Home / Self Care | Payer: Medicare PPO | Source: Ambulatory Visit | Attending: Radiation Oncology | Admitting: Radiation Oncology

## 2020-01-12 DIAGNOSIS — Z510 Encounter for antineoplastic radiation therapy: Secondary | ICD-10-CM | POA: Diagnosis not present

## 2020-01-13 ENCOUNTER — Ambulatory Visit
Admission: RE | Admit: 2020-01-13 | Discharge: 2020-01-13 | Disposition: A | Discharge status: Home / Self Care | Payer: Medicare PPO | Source: Ambulatory Visit | Attending: Radiation Oncology | Admitting: Radiation Oncology

## 2020-01-13 ENCOUNTER — Other Ambulatory Visit: Payer: Self-pay

## 2020-01-13 DIAGNOSIS — Z510 Encounter for antineoplastic radiation therapy: Secondary | ICD-10-CM | POA: Diagnosis present

## 2020-01-13 DIAGNOSIS — C61 Malignant neoplasm of prostate: Secondary | ICD-10-CM | POA: Diagnosis not present

## 2020-01-14 ENCOUNTER — Other Ambulatory Visit: Payer: Self-pay | Admitting: Radiation Oncology

## 2020-01-14 ENCOUNTER — Other Ambulatory Visit: Payer: Self-pay

## 2020-01-14 ENCOUNTER — Ambulatory Visit
Admission: RE | Admit: 2020-01-14 | Discharge: 2020-01-14 | Disposition: A | Discharge status: Home / Self Care | Payer: Medicare PPO | Source: Ambulatory Visit | Attending: Radiation Oncology | Admitting: Radiation Oncology

## 2020-01-14 DIAGNOSIS — C61 Malignant neoplasm of prostate: Secondary | ICD-10-CM

## 2020-01-14 DIAGNOSIS — Z510 Encounter for antineoplastic radiation therapy: Secondary | ICD-10-CM | POA: Diagnosis not present

## 2020-01-14 MED ORDER — TAMSULOSIN HCL 0.4 MG PO CAPS: 0.4000 mg | ORAL_CAPSULE | Freq: Every day | ORAL | 0 refills | Status: DC

## 2020-01-17 ENCOUNTER — Other Ambulatory Visit: Payer: Self-pay

## 2020-01-17 ENCOUNTER — Ambulatory Visit
Admission: RE | Admit: 2020-01-17 | Discharge: 2020-01-17 | Disposition: A | Discharge status: Home / Self Care | Payer: Medicare PPO | Source: Ambulatory Visit | Attending: Radiation Oncology | Admitting: Radiation Oncology

## 2020-01-17 ENCOUNTER — Telehealth: Payer: Self-pay | Admitting: Radiation Oncology

## 2020-01-17 DIAGNOSIS — Z510 Encounter for antineoplastic radiation therapy: Secondary | ICD-10-CM | POA: Diagnosis not present

## 2020-01-17 NOTE — Telephone Encounter (Signed)
Received message from Access Nurse (after hours nurse) detailing patients problem with obtaining flomax script. Phoned patient to inquire about status. Patient confirms he has picked up this medication and it seems to really be helping. Patient had complained during his PUT visit on Friday with Isidore Moos that he wasn't emptying his bladder completely thus had frequency and urgency. Encouraged patient to contact this RN with future needs. Patient verbalized understanding of all reviewed and expressed appreciation for the call.

## 2020-01-18 ENCOUNTER — Other Ambulatory Visit: Payer: Self-pay

## 2020-01-18 ENCOUNTER — Ambulatory Visit
Admission: RE | Admit: 2020-01-18 | Discharge: 2020-01-18 | Disposition: A | Discharge status: Home / Self Care | Payer: Medicare PPO | Source: Ambulatory Visit | Attending: Radiation Oncology | Admitting: Radiation Oncology

## 2020-01-18 DIAGNOSIS — Z510 Encounter for antineoplastic radiation therapy: Secondary | ICD-10-CM | POA: Diagnosis not present

## 2020-01-19 ENCOUNTER — Ambulatory Visit
Admission: RE | Admit: 2020-01-19 | Discharge: 2020-01-19 | Disposition: A | Discharge status: Home / Self Care | Payer: Medicare PPO | Source: Ambulatory Visit | Attending: Radiation Oncology | Admitting: Radiation Oncology

## 2020-01-19 ENCOUNTER — Other Ambulatory Visit: Payer: Self-pay

## 2020-01-19 DIAGNOSIS — Z510 Encounter for antineoplastic radiation therapy: Secondary | ICD-10-CM | POA: Diagnosis not present

## 2020-01-19 NOTE — Addendum Note (Signed)
Encounter addended by: Heywood Footman, RN on: 01/19/2020 3:47 PM  Actions taken: Patient Education assessment filed

## 2020-01-20 ENCOUNTER — Other Ambulatory Visit: Payer: Self-pay

## 2020-01-20 ENCOUNTER — Ambulatory Visit
Admission: RE | Admit: 2020-01-20 | Discharge: 2020-01-20 | Disposition: A | Discharge status: Home / Self Care | Payer: Medicare PPO | Source: Ambulatory Visit | Attending: Radiation Oncology | Admitting: Radiation Oncology

## 2020-01-20 DIAGNOSIS — Z510 Encounter for antineoplastic radiation therapy: Secondary | ICD-10-CM | POA: Diagnosis not present

## 2020-01-21 ENCOUNTER — Other Ambulatory Visit: Payer: Self-pay

## 2020-01-21 ENCOUNTER — Ambulatory Visit
Admission: RE | Admit: 2020-01-21 | Discharge: 2020-01-21 | Disposition: A | Discharge status: Home / Self Care | Payer: Medicare PPO | Source: Ambulatory Visit | Attending: Radiation Oncology | Admitting: Radiation Oncology

## 2020-01-21 DIAGNOSIS — Z510 Encounter for antineoplastic radiation therapy: Secondary | ICD-10-CM | POA: Diagnosis not present

## 2020-01-24 ENCOUNTER — Other Ambulatory Visit: Payer: Self-pay

## 2020-01-24 ENCOUNTER — Ambulatory Visit
Admission: RE | Admit: 2020-01-24 | Discharge: 2020-01-24 | Disposition: A | Discharge status: Home / Self Care | Payer: Medicare PPO | Source: Ambulatory Visit | Attending: Radiation Oncology | Admitting: Radiation Oncology

## 2020-01-24 DIAGNOSIS — Z510 Encounter for antineoplastic radiation therapy: Secondary | ICD-10-CM | POA: Diagnosis not present

## 2020-01-25 ENCOUNTER — Ambulatory Visit
Admission: RE | Admit: 2020-01-25 | Discharge: 2020-01-25 | Disposition: A | Discharge status: Home / Self Care | Payer: Medicare PPO | Source: Ambulatory Visit | Attending: Radiation Oncology | Admitting: Radiation Oncology

## 2020-01-25 ENCOUNTER — Other Ambulatory Visit: Payer: Self-pay

## 2020-01-25 DIAGNOSIS — Z510 Encounter for antineoplastic radiation therapy: Secondary | ICD-10-CM | POA: Diagnosis not present

## 2020-01-26 ENCOUNTER — Ambulatory Visit
Admission: RE | Admit: 2020-01-26 | Discharge: 2020-01-26 | Disposition: A | Discharge status: Home / Self Care | Payer: Medicare PPO | Source: Ambulatory Visit | Attending: Radiation Oncology | Admitting: Radiation Oncology

## 2020-01-26 ENCOUNTER — Other Ambulatory Visit: Payer: Self-pay

## 2020-01-26 DIAGNOSIS — Z510 Encounter for antineoplastic radiation therapy: Secondary | ICD-10-CM | POA: Diagnosis not present

## 2020-01-27 ENCOUNTER — Other Ambulatory Visit: Payer: Self-pay

## 2020-01-27 ENCOUNTER — Ambulatory Visit
Admission: RE | Admit: 2020-01-27 | Discharge: 2020-01-27 | Disposition: A | Discharge status: Home / Self Care | Payer: Medicare PPO | Source: Ambulatory Visit | Attending: Radiation Oncology | Admitting: Radiation Oncology

## 2020-01-27 DIAGNOSIS — Z510 Encounter for antineoplastic radiation therapy: Secondary | ICD-10-CM | POA: Diagnosis not present

## 2020-01-28 ENCOUNTER — Ambulatory Visit
Admission: RE | Admit: 2020-01-28 | Discharge: 2020-01-28 | Disposition: A | Discharge status: Home / Self Care | Payer: Medicare PPO | Source: Ambulatory Visit | Attending: Radiation Oncology | Admitting: Radiation Oncology

## 2020-01-28 ENCOUNTER — Other Ambulatory Visit: Payer: Self-pay

## 2020-01-28 DIAGNOSIS — Z510 Encounter for antineoplastic radiation therapy: Secondary | ICD-10-CM | POA: Diagnosis not present

## 2020-01-29 ENCOUNTER — Ambulatory Visit: Payer: Medicare PPO

## 2020-01-31 ENCOUNTER — Other Ambulatory Visit: Payer: Self-pay

## 2020-01-31 ENCOUNTER — Ambulatory Visit
Admission: RE | Admit: 2020-01-31 | Discharge: 2020-01-31 | Disposition: A | Discharge status: Home / Self Care | Payer: Medicare PPO | Source: Ambulatory Visit | Attending: Radiation Oncology | Admitting: Radiation Oncology

## 2020-01-31 DIAGNOSIS — Z510 Encounter for antineoplastic radiation therapy: Secondary | ICD-10-CM | POA: Diagnosis not present

## 2020-02-01 ENCOUNTER — Other Ambulatory Visit: Payer: Self-pay

## 2020-02-01 ENCOUNTER — Ambulatory Visit
Admission: RE | Admit: 2020-02-01 | Discharge: 2020-02-01 | Disposition: A | Discharge status: Home / Self Care | Payer: Medicare PPO | Source: Ambulatory Visit | Attending: Radiation Oncology | Admitting: Radiation Oncology

## 2020-02-01 DIAGNOSIS — Z510 Encounter for antineoplastic radiation therapy: Secondary | ICD-10-CM | POA: Diagnosis not present

## 2020-02-02 ENCOUNTER — Ambulatory Visit
Admission: RE | Admit: 2020-02-02 | Discharge: 2020-02-02 | Disposition: A | Discharge status: Home / Self Care | Payer: Medicare PPO | Source: Ambulatory Visit | Attending: Radiation Oncology | Admitting: Radiation Oncology

## 2020-02-02 ENCOUNTER — Other Ambulatory Visit: Payer: Self-pay

## 2020-02-02 DIAGNOSIS — Z510 Encounter for antineoplastic radiation therapy: Secondary | ICD-10-CM | POA: Diagnosis not present

## 2020-02-03 ENCOUNTER — Other Ambulatory Visit: Payer: Self-pay

## 2020-02-03 ENCOUNTER — Ambulatory Visit
Admission: RE | Admit: 2020-02-03 | Discharge: 2020-02-03 | Disposition: A | Discharge status: Home / Self Care | Payer: Medicare PPO | Source: Ambulatory Visit | Attending: Radiation Oncology | Admitting: Radiation Oncology

## 2020-02-03 DIAGNOSIS — Z510 Encounter for antineoplastic radiation therapy: Secondary | ICD-10-CM | POA: Diagnosis not present

## 2020-02-04 ENCOUNTER — Ambulatory Visit
Admission: RE | Admit: 2020-02-04 | Discharge: 2020-02-04 | Disposition: A | Discharge status: Home / Self Care | Payer: Medicare PPO | Source: Ambulatory Visit | Attending: Radiation Oncology | Admitting: Radiation Oncology

## 2020-02-04 ENCOUNTER — Other Ambulatory Visit: Payer: Self-pay

## 2020-02-04 DIAGNOSIS — Z510 Encounter for antineoplastic radiation therapy: Secondary | ICD-10-CM | POA: Diagnosis not present

## 2020-02-07 ENCOUNTER — Ambulatory Visit
Admission: RE | Admit: 2020-02-07 | Discharge: 2020-02-07 | Disposition: A | Discharge status: Home / Self Care | Payer: Medicare PPO | Source: Ambulatory Visit | Attending: Radiation Oncology | Admitting: Radiation Oncology

## 2020-02-07 ENCOUNTER — Other Ambulatory Visit: Payer: Self-pay

## 2020-02-07 DIAGNOSIS — Z510 Encounter for antineoplastic radiation therapy: Secondary | ICD-10-CM | POA: Diagnosis not present

## 2020-02-08 ENCOUNTER — Ambulatory Visit
Admission: RE | Admit: 2020-02-08 | Discharge: 2020-02-08 | Disposition: A | Discharge status: Home / Self Care | Payer: Medicare PPO | Source: Ambulatory Visit | Attending: Radiation Oncology | Admitting: Radiation Oncology

## 2020-02-08 ENCOUNTER — Other Ambulatory Visit: Payer: Self-pay

## 2020-02-08 DIAGNOSIS — Z510 Encounter for antineoplastic radiation therapy: Secondary | ICD-10-CM | POA: Diagnosis not present

## 2020-02-09 ENCOUNTER — Other Ambulatory Visit: Payer: Self-pay

## 2020-02-09 ENCOUNTER — Ambulatory Visit
Admission: RE | Admit: 2020-02-09 | Discharge: 2020-02-09 | Disposition: A | Discharge status: Home / Self Care | Payer: Medicare PPO | Source: Ambulatory Visit | Attending: Radiation Oncology | Admitting: Radiation Oncology

## 2020-02-09 DIAGNOSIS — Z510 Encounter for antineoplastic radiation therapy: Secondary | ICD-10-CM | POA: Diagnosis not present

## 2020-02-10 ENCOUNTER — Other Ambulatory Visit: Payer: Self-pay

## 2020-02-10 ENCOUNTER — Ambulatory Visit
Admission: RE | Admit: 2020-02-10 | Discharge: 2020-02-10 | Disposition: A | Discharge status: Home / Self Care | Payer: Medicare PPO | Source: Ambulatory Visit | Attending: Radiation Oncology | Admitting: Radiation Oncology

## 2020-02-10 DIAGNOSIS — Z510 Encounter for antineoplastic radiation therapy: Secondary | ICD-10-CM | POA: Diagnosis not present

## 2020-02-11 ENCOUNTER — Ambulatory Visit
Admission: RE | Admit: 2020-02-11 | Discharge: 2020-02-11 | Disposition: A | Discharge status: Home / Self Care | Payer: Medicare PPO | Source: Ambulatory Visit | Attending: Radiation Oncology | Admitting: Radiation Oncology

## 2020-02-11 ENCOUNTER — Other Ambulatory Visit: Payer: Self-pay

## 2020-02-11 DIAGNOSIS — Z510 Encounter for antineoplastic radiation therapy: Secondary | ICD-10-CM | POA: Diagnosis not present

## 2020-02-14 ENCOUNTER — Other Ambulatory Visit: Payer: Self-pay

## 2020-02-14 ENCOUNTER — Ambulatory Visit
Admission: RE | Admit: 2020-02-14 | Discharge: 2020-02-14 | Disposition: A | Discharge status: Home / Self Care | Payer: Medicare PPO | Source: Ambulatory Visit | Attending: Radiation Oncology | Admitting: Radiation Oncology

## 2020-02-14 DIAGNOSIS — Z510 Encounter for antineoplastic radiation therapy: Secondary | ICD-10-CM | POA: Diagnosis not present

## 2020-02-14 DIAGNOSIS — C61 Malignant neoplasm of prostate: Secondary | ICD-10-CM | POA: Insufficient documentation

## 2020-02-15 ENCOUNTER — Other Ambulatory Visit: Payer: Self-pay

## 2020-02-15 ENCOUNTER — Ambulatory Visit
Admission: RE | Admit: 2020-02-15 | Discharge: 2020-02-15 | Disposition: A | Discharge status: Home / Self Care | Payer: Medicare PPO | Source: Ambulatory Visit | Attending: Radiation Oncology | Admitting: Radiation Oncology

## 2020-02-15 DIAGNOSIS — Z510 Encounter for antineoplastic radiation therapy: Secondary | ICD-10-CM | POA: Diagnosis not present

## 2020-02-16 ENCOUNTER — Other Ambulatory Visit: Payer: Self-pay

## 2020-02-16 ENCOUNTER — Ambulatory Visit
Admission: RE | Admit: 2020-02-16 | Discharge: 2020-02-16 | Disposition: A | Discharge status: Home / Self Care | Payer: Medicare PPO | Source: Ambulatory Visit | Attending: Radiation Oncology | Admitting: Radiation Oncology

## 2020-02-16 DIAGNOSIS — Z510 Encounter for antineoplastic radiation therapy: Secondary | ICD-10-CM | POA: Diagnosis not present

## 2020-02-17 ENCOUNTER — Other Ambulatory Visit: Payer: Self-pay

## 2020-02-17 ENCOUNTER — Ambulatory Visit
Admission: RE | Admit: 2020-02-17 | Discharge: 2020-02-17 | Disposition: A | Discharge status: Home / Self Care | Payer: Medicare PPO | Source: Ambulatory Visit | Attending: Radiation Oncology | Admitting: Radiation Oncology

## 2020-02-17 DIAGNOSIS — Z510 Encounter for antineoplastic radiation therapy: Secondary | ICD-10-CM | POA: Diagnosis not present

## 2020-02-18 ENCOUNTER — Other Ambulatory Visit: Payer: Self-pay

## 2020-02-18 ENCOUNTER — Ambulatory Visit
Admission: RE | Admit: 2020-02-18 | Discharge: 2020-02-18 | Disposition: A | Discharge status: Home / Self Care | Payer: Medicare PPO | Source: Ambulatory Visit | Attending: Radiation Oncology | Admitting: Radiation Oncology

## 2020-02-18 ENCOUNTER — Ambulatory Visit: Payer: Medicare PPO

## 2020-02-18 DIAGNOSIS — Z510 Encounter for antineoplastic radiation therapy: Secondary | ICD-10-CM | POA: Diagnosis not present

## 2020-02-21 ENCOUNTER — Encounter: Payer: Self-pay | Admitting: Urology

## 2020-02-21 ENCOUNTER — Ambulatory Visit
Admission: RE | Admit: 2020-02-21 | Discharge: 2020-02-21 | Disposition: A | Discharge status: Home / Self Care | Payer: Medicare PPO | Source: Ambulatory Visit | Attending: Radiation Oncology | Admitting: Radiation Oncology

## 2020-02-21 ENCOUNTER — Encounter: Payer: Self-pay | Admitting: Medical Oncology

## 2020-02-21 ENCOUNTER — Other Ambulatory Visit: Payer: Self-pay

## 2020-02-21 DIAGNOSIS — Z510 Encounter for antineoplastic radiation therapy: Secondary | ICD-10-CM | POA: Diagnosis not present

## 2020-03-21 ENCOUNTER — Other Ambulatory Visit: Payer: Self-pay

## 2020-03-21 ENCOUNTER — Telehealth: Payer: Self-pay

## 2020-03-21 ENCOUNTER — Encounter: Payer: Self-pay | Admitting: Urology

## 2020-03-21 NOTE — Telephone Encounter (Signed)
Spoke with pt is regards to 1 month follow-up telephone appointment with Blake Camacho on 03/23/20 at 2:30pm. Pt verbalized understanding of telephone appointment date and time.

## 2020-03-21 NOTE — Progress Notes (Signed)
Meaningful use and prostate questions asked via telephone call prior to appointment. Pt states that he not scheduled an appointment yet with [facility]. Pt states that he would like a refill on Flomax.  Pt states nocturia 1 time per night. Pt states continued urgency. Pt states mild dysuria. Pt states having regular bowel movements. Pt states urine stream is weak. Pt states that he is not emptying his bladder completely and returning to the bathroom 20-30 minutes later. Pt sates having leakage post void. Pt states having hesitancy prior to urination.

## 2020-03-22 NOTE — Progress Notes (Signed)
Radiation Oncology         ([phone]) [phone] ________________________________  Name: Blake Camacho MRN: [id number]  Date: 03/23/2020  DOB: 11/16/1950  Post Treatment Note  CC: Blake Limbo, MD  Blake Aloe, MD  Diagnosis:   69 y.o. 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 The prostate, seminal vesicles, and pelvic lymph nodes were initially treated to 45 Gy in 25 fractions of 1.8 Gy  2. The prostate only was boosted to 75 Gy with 15 additional fractions of 2.0 Gy    Narrative:  I spoke with the patient to conduct his routine scheduled 1 month follow up visit via telephone to spare the patient unnecessary potential exposure in the healthcare setting during the current COVID-19 pandemic.  The patient was notified in advance and gave permission to proceed with this visit format. He tolerated radiation treatment relatively well with only minor urinary irritation and modest fatigue.                                On review of systems, the patient states that he has had persistent weakened flow of stream, urgency, frequency, hesitancy, intermittency and incomplete bladder emptying all of which were improved when he was taking Flomax daily but he has recently run out of this medication.  He reports nocturia x1 and denies dysuria, gross hematuria, fever, chills, or night sweats.  He denies abdominal pain, nausea, vomiting, diarrhea or constipation.  He continues with fatigue and hot flashes associated with his ADT but reports that this is tolerable.  Overall, he is pleased with his progress to date.  ALLERGIES:  is allergic to aspirin.  Meds: Current Outpatient Medications  Medication Sig Dispense Refill  . amLODipine (NORVASC) 10 MG tablet TAKE ONE TABLET BY MOUTH ONCE DAILY    . hydrochlorothiazide  (HYDRODIURIL) 25 MG tablet TAKE ONE TABLET BY MOUTH ONCE DAILY IN THE MORNING    . metFORMIN (GLUCOPHAGE) 500 MG tablet TAKE ONE TABLET BY MOUTH DAILY WITH BREAKFAST FOR 1 WEEK, THEN INCREASE TO ONE TABLET TWICE DAILY WITH BREAKFAST AND DINNER.    Marland Kitchen atorvastatin (LIPITOR) 10 MG tablet TAKE ONE TABLET BY MOUTH ONCE DAILY    . LORazepam (ATIVAN) 1 MG tablet Take 1 tablet (1 mg total) by mouth as needed for anxiety (Take one tablet 30 minutes prior to MRI and may repeat once just prior to scan if needed.). (Patient not taking: Reported on 03/21/2020) 2 tablet 0  . tamsulosin (FLOMAX) 0.4 MG CAPS capsule Take 1 capsule (0.4 mg total) by mouth at bedtime. (Patient not taking: Reported on 03/21/2020) 30 capsule 0   Current Facility-Administered Medications  Medication Dose Route Frequency Provider Last Rate Last Admin  . 0.9 %  sodium chloride infusion  500 mL Intravenous Continuous Armbruster, Carlota Raspberry, MD        Physical Findings:  vitals were not taken for this visit.   Karen Kays to assess due to telephone follow-up visit format.  Lab Findings: No results found for: WBC, HGB, HCT, MCV, PLT   Radiographic Findings: No results found.  Impression/Plan: 1. 69 y.o. 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   He will continue to follow up with urology  for ongoing PSA determinations and does not currently have an appointment scheduled with Dr. Junious Camacho to his knowledge.  He will be due for his next Eligard ADT injection in early July 2021 and this will likely be coordinated with his follow-up visit.  He understands what to expect with regards to PSA monitoring going forward. I will look forward to following his response to treatment via correspondence with urology, and would be happy to continue to participate in his care if clinically indicated. I talked to the patient about what to expect in the future, including his risk for erectile dysfunction and  rectal bleeding. I encouraged him to call or return to the office if he has any questions regarding his previous radiation or possible radiation side effects. He was comfortable with this plan and will follow up as needed.  Today, a comprehensive survivorship care plan and treatment summary was reviewed with the patient today detailing his prostate cancer diagnosis, treatment course, potential late/long-term effects of treatment, appropriate follow-up care with recommendations for the future, and patient education resources.  A copy of this summary, along with a letter will be sent to the patient's primary care provider via mail/fax/In Basket message after today's visit.  2. Cancer screening:  Due to Mr. Blake Camacho's history and his age, he should receive screening for skin cancers, colon cancer, and lung cancer.  The information and recommendations are listed on the patient's comprehensive care plan/treatment summary and were reviewed in detail with the patient.     3. Health maintenance and wellness promotion: Mr. Blake Camacho was encouraged to consume 5-7 servings of fruits and vegetables per day. He was provided a copy of the "Nutrition Rainbow" handout, as well as the handout "Take Control of Your Health and [place]anning" from the [place].  He was also encouraged to engage in moderate to vigorous exercise for 30 minutes per day most days of the week. Information was provided regarding the [facility] fitness program, which is designed for cancer survivors to help them become more physically fit after cancer treatments. We discussed that a healthy BMI is 18.5-24.9 and that maintaining a healthy weight reduces risk of cancer recurrences.  He was instructed to limit his alcohol consumption and was encouraged to stop smoking.  Lastly, he was encouraged to use sunscreen and wear protective clothing when in the sun.     4. Support services/counseling: It is not uncommon for this period  of the patient's cancer care trajectory to be one of many emotions and stressors.  Mr. Blake Camacho was encouraged to take advantage of our many support services programs, support groups, and/or counseling in coping with his new life as a cancer survivor after completing anti-cancer treatment.  He was offered support today through active listening and expressive supportive counseling.  He was given information regarding our available services and encouraged to contact me with any questions or for help enrolling in any of our support group/programs.       Nicholos Johns, PA-C

## 2020-03-23 ENCOUNTER — Other Ambulatory Visit: Payer: Self-pay | Admitting: Urology

## 2020-03-23 ENCOUNTER — Ambulatory Visit
Admission: RE | Admit: 2020-03-23 | Discharge: 2020-03-23 | Disposition: A | Discharge status: Home / Self Care | Payer: Medicare PPO | Source: Ambulatory Visit | Attending: Urology | Admitting: Urology

## 2020-03-23 ENCOUNTER — Other Ambulatory Visit: Payer: Self-pay

## 2020-03-23 DIAGNOSIS — C61 Malignant neoplasm of prostate: Secondary | ICD-10-CM

## 2020-03-23 MED ORDER — TAMSULOSIN HCL 0.4 MG PO CAPS: 0.4000 mg | ORAL_CAPSULE | Freq: Every day | ORAL | 2 refills | Status: AC

## 2020-04-24 ENCOUNTER — Encounter: Payer: Self-pay | Admitting: Gastroenterology

## 2020-05-19 NOTE — Progress Notes (Signed)
  Radiation Oncology         [phone] ________________________________  Name: Blake Camacho MRN: [id number]  Date: 02/21/2020  DOB: [birth year]  End of Treatment Note  Diagnosis:   69 y.o. 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 The prostate, seminal vesicles, and pelvic lymph nodes were initially treated to 45 Gy in 25 fractions of 1.8 Gy  2. The prostate only was boosted to 75 Gy with 15 additional fractions of 2.0 Gy   Beams/energy:  1. The prostate, seminal vesicles, and pelvic lymph nodes were initially treated using VMAT intensity modulated radiotherapy delivering 6 megavolt photons. Image guidance was performed with CB-CT studies prior to each fraction. He was immobilized with a body fix lower extremity mold.  2. the prostate only was boosted using VMAT intensity modulated radiotherapy delivering 6 megavolt photons. Image guidance was performed with CB-CT studies prior to each fraction. He was immobilized with a body fix lower extremity mold.  Narrative: The patient tolerated radiation treatment relatively well with only minor urinary irritation and modest fatigue.    Plan: The patient has completed radiation treatment. He will return to radiation oncology clinic for routine followup in one month. I advised him to call or return sooner if he has any questions or concerns related to his recovery or treatment. ________________________________  Sheral Apley. Tammi Klippel, M.D.

## 2020-07-07 ENCOUNTER — Other Ambulatory Visit: Payer: Self-pay | Admitting: Urology

## 2020-07-07 DIAGNOSIS — C61 Malignant neoplasm of prostate: Secondary | ICD-10-CM

## 2020-07-17 ENCOUNTER — Telehealth: Payer: Self-pay | Admitting: Radiation Oncology

## 2020-07-17 NOTE — Telephone Encounter (Signed)
Fax refill request for tamsulosin received. Denies refill since patient is no longer under the care of Dr. Tammi Klippel but encouraged that the patient should reach out to Dr. Junious Silk, urology. Fax confirmation of delivery obtained.

## 2020-07-26 ENCOUNTER — Telehealth: Payer: Self-pay | Admitting: Radiation Oncology

## 2020-07-26 NOTE — Telephone Encounter (Signed)
Phoned patient's pharmacy as requested by Freeman Caldron, PA-C. Left a detailed message on the prescriber's voicemail that PA, Bruning is denying refill of his tamsulosin. Explained the patient is no longer receiving radiation therapy thus refills of this medication should come from his urologist. Provided my direct number for further questions reference this matter.

## 2021-04-08 IMAGING — DX DG CERVICAL SPINE COMPLETE 4+V
6 series · 6 of 6 positions shown · non-contrast
Comparison: September 06, 2019.

CLINICAL DATA: Neck pain. Abnormal uptake on bone scan.

EXAM:
CERVICAL SPINE - COMPLETE 4+ VIEW

[c-spine lat]
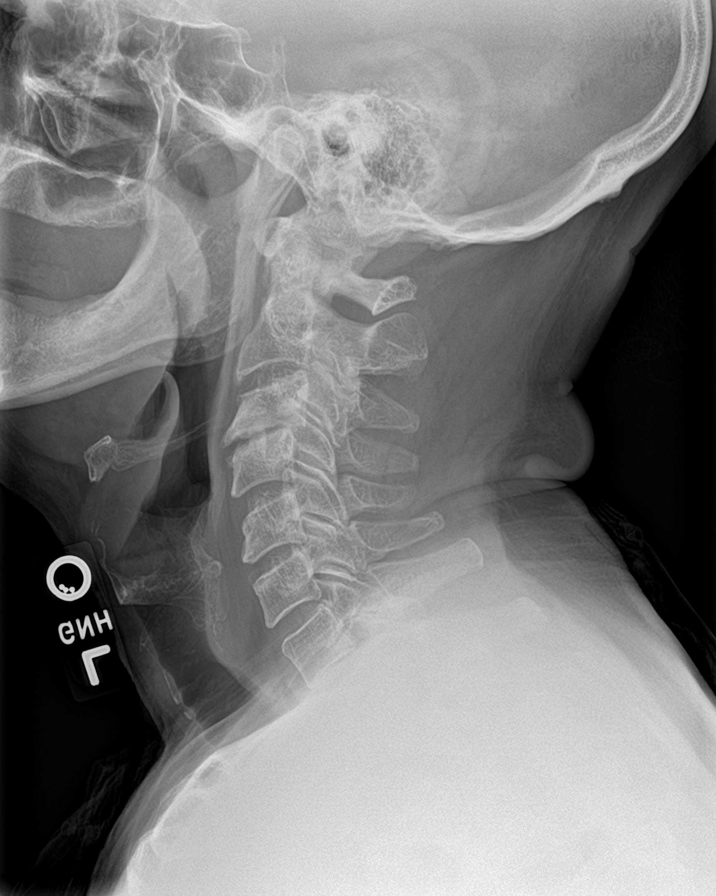

[c-spine obl (1 of 2)]
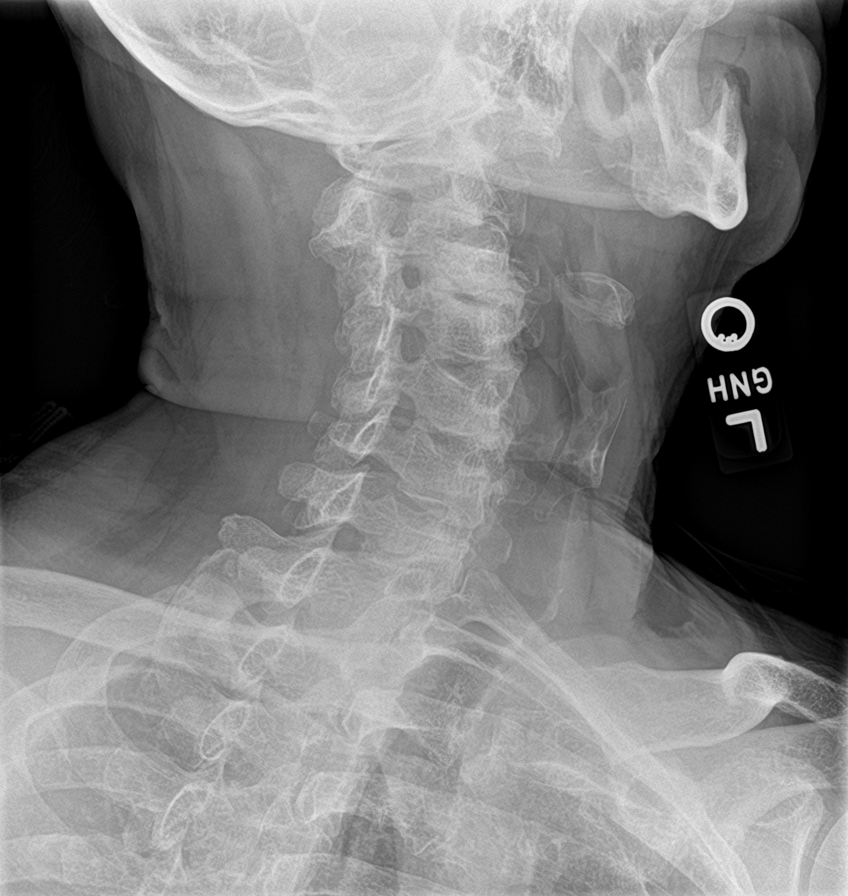

[c-spine obl (2 of 2)]
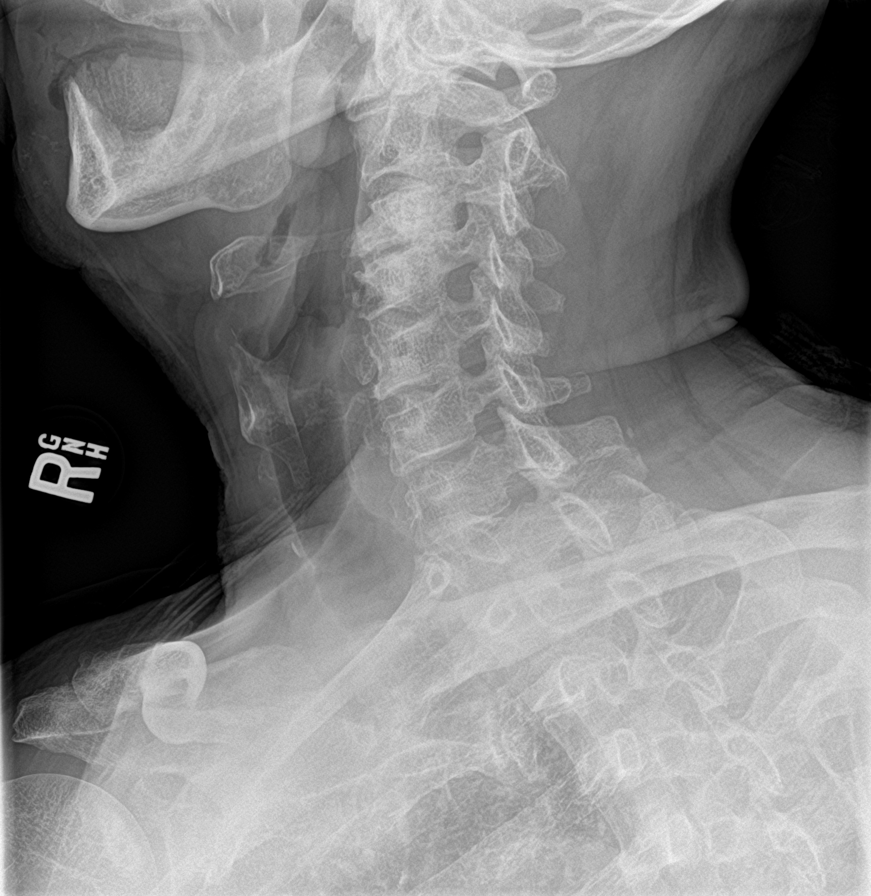

[c-spine ap]
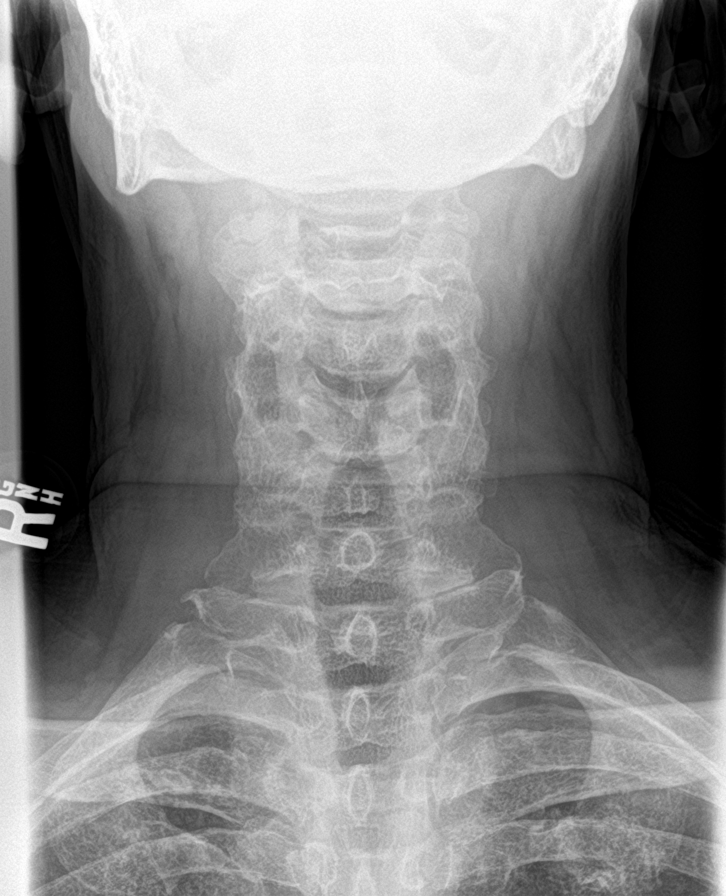

[c-spine open mouth]
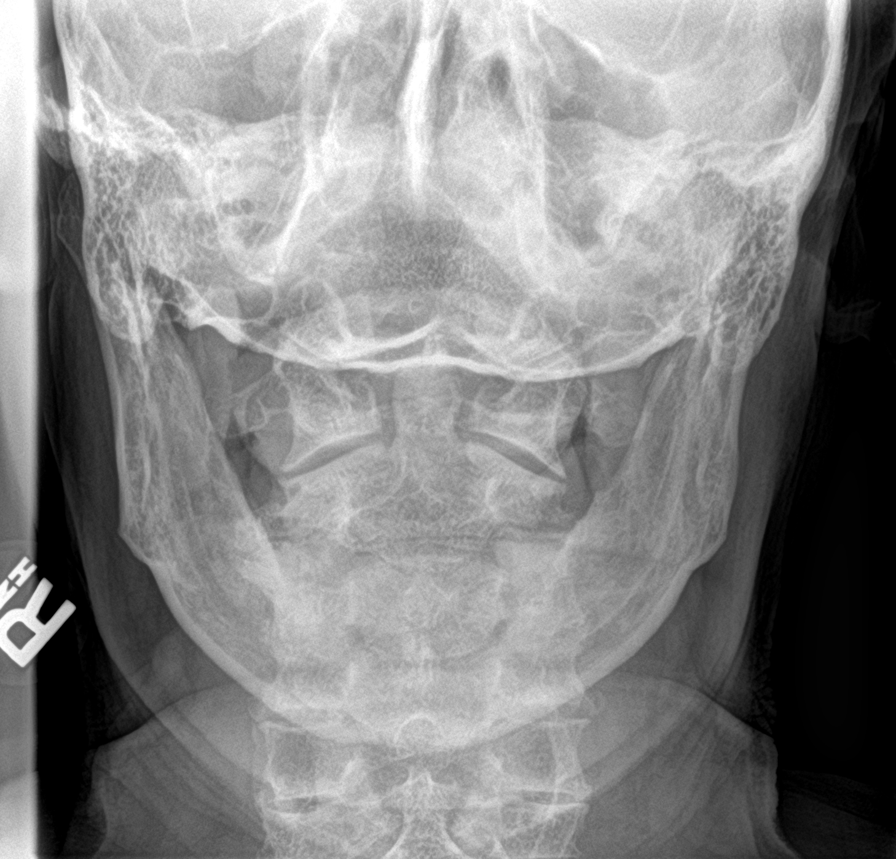

[c-spine swimmers]
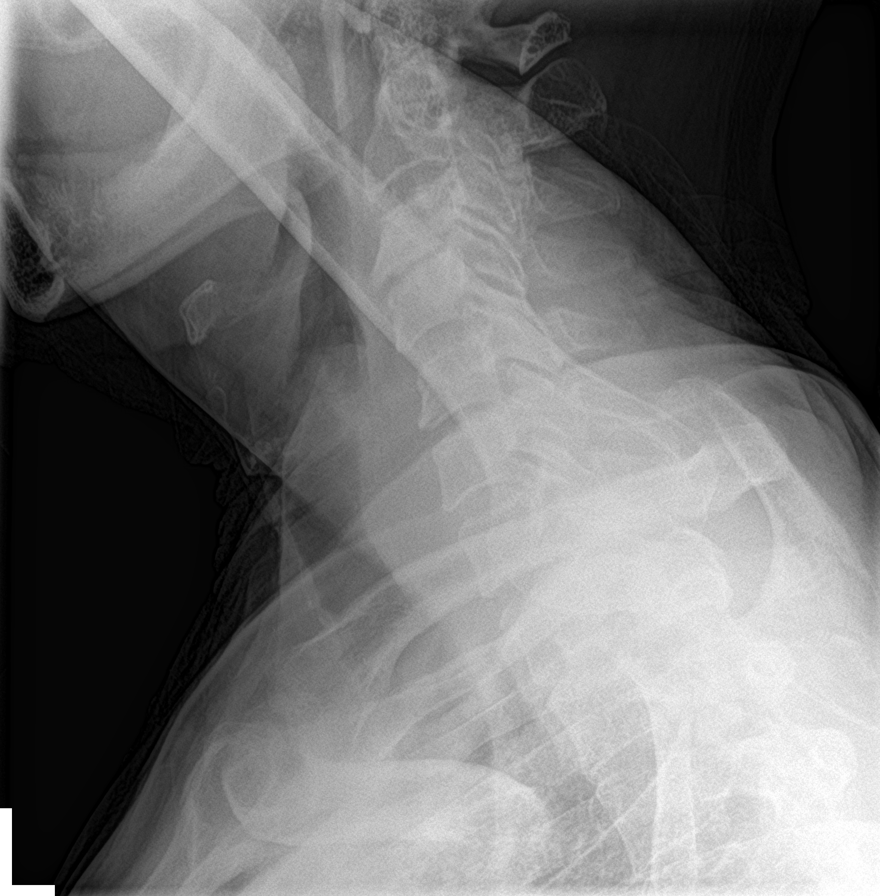

[6 of 6 positions shown; findings below may reference images not displayed]

FINDINGS: Mild grade 1 retrolisthesis of C3-4 is noted secondary to severe
degenerative disc disease at this level. Anterior osteophyte
formation is also noted at C3-4. No fracture is noted. No
prevertebral soft tissue swelling is noted. Remaining disc spaces
are unremarkable. Mild neural foraminal stenosis is noted on the
left at C3-4 secondary to uncovertebral spurring.
IMPRESSION: Severe degenerative disc disease is noted at C3-4. Mild neural
foraminal stenosis is noted on the left at C3-4 secondary to
uncovertebral spurring. No acute abnormality seen in the cervical
spine.
